# Patient Record
Sex: Female | Born: 1980 | Race: White | Hispanic: No | Marital: Married | Smoking: Never smoker
Health system: Southern US, Community
[De-identification: ages and names within clinical notes are randomized; demographics above are authoritative.]

## PROBLEM LIST (undated history)

## (undated) DIAGNOSIS — R112 Nausea with vomiting, unspecified: Secondary | ICD-10-CM

## (undated) DIAGNOSIS — R519 Headache, unspecified: Secondary | ICD-10-CM

## (undated) DIAGNOSIS — F419 Anxiety disorder, unspecified: Secondary | ICD-10-CM

## (undated) DIAGNOSIS — Z9889 Other specified postprocedural states: Secondary | ICD-10-CM

## (undated) DIAGNOSIS — K219 Gastro-esophageal reflux disease without esophagitis: Secondary | ICD-10-CM

## (undated) DIAGNOSIS — F32A Depression, unspecified: Secondary | ICD-10-CM

## (undated) DIAGNOSIS — J189 Pneumonia, unspecified organism: Secondary | ICD-10-CM

## (undated) DIAGNOSIS — R51 Headache: Secondary | ICD-10-CM

## (undated) DIAGNOSIS — F329 Major depressive disorder, single episode, unspecified: Secondary | ICD-10-CM

## (undated) HISTORY — PX: CARPAL TUNNEL RELEASE: SHX101

---

## 2014-12-15 ENCOUNTER — Emergency Department
Admit: 2014-12-15 | Disposition: A | Discharge status: Home / Self Care | Payer: Self-pay | Admitting: Emergency Medicine

## 2014-12-15 LAB — COMPREHENSIVE METABOLIC PANEL
ALT: 43 U/L
Albumin: 4.1 g/dL
Alkaline Phosphatase: 84 U/L
Anion Gap: 4 — ABNORMAL LOW (ref 7–16)
BUN: 11 mg/dL
Bilirubin,Total: 0.6 mg/dL
CHLORIDE: 108 mmol/L
CO2: 25 mmol/L
CREATININE: 0.73 mg/dL
Calcium, Total: 8.7 mg/dL — ABNORMAL LOW
EGFR (African American): >60
EGFR (Non-African Amer.): >60
Glucose: 142 mg/dL — ABNORMAL HIGH
Potassium: 3.4 mmol/L — ABNORMAL LOW
SGOT(AST): 33 U/L
SODIUM: 137 mmol/L
TOTAL PROTEIN: 7.9 g/dL

## 2014-12-15 LAB — CBC
HCT: 44.4 % (ref 35.0–47.0)
HGB: 14.7 g/dL (ref 12.0–16.0)
MCH: 27.9 pg (ref 26.0–34.0)
MCHC: 33.1 g/dL (ref 32.0–36.0)
MCV: 84 fL (ref 80–100)
Platelet: 223 10*3/uL (ref 150–440)
RBC: 5.27 10*6/uL — ABNORMAL HIGH (ref 3.80–5.20)
RDW: 13.9 % (ref 11.5–14.5)
WBC: 7.3 10*3/uL (ref 3.6–11.0)

## 2014-12-15 LAB — URINALYSIS, COMPLETE
Bilirubin,UR: NEGATIVE
Glucose,UR: NEGATIVE mg/dL (ref 0–75)
Ketone: NEGATIVE
Leukocyte Esterase: NEGATIVE
NITRITE: NEGATIVE
PROTEIN: NEGATIVE
Ph: 5 (ref 4.5–8.0)
SPECIFIC GRAVITY: 1.017 (ref 1.003–1.030)
Squamous Epithelial: 2
WBC UR: 3 /HPF (ref 0–5)

## 2014-12-15 LAB — TROPONIN I: Troponin-I: <0.03 ng/mL

## 2014-12-15 LAB — CK TOTAL AND CKMB (NOT AT ARMC)
CK, Total: 75 U/L
CK-MB: 1.3 ng/mL

## 2014-12-15 LAB — LIPASE, BLOOD: LIPASE: 50 U/L

## 2015-01-04 ENCOUNTER — Ambulatory Visit
Admit: 2015-01-04 | Disposition: A | Discharge status: Home / Self Care | Payer: Self-pay | Attending: Obstetrics and Gynecology | Admitting: Obstetrics and Gynecology

## 2015-01-14 ENCOUNTER — Inpatient Hospital Stay
Admission: RE | Admit: 2015-01-14 | Discharge: 2015-01-14 | Disposition: A | Discharge status: Home / Self Care | Payer: Self-pay | Source: Ambulatory Visit

## 2015-01-14 HISTORY — DX: Pneumonia, unspecified organism: J18.9

## 2015-01-14 HISTORY — DX: Headache: R51

## 2015-01-14 HISTORY — DX: Other specified postprocedural states: Z98.890

## 2015-01-14 HISTORY — DX: Nausea with vomiting, unspecified: R11.2

## 2015-01-14 HISTORY — DX: Depression, unspecified: F32.A

## 2015-01-14 HISTORY — DX: Major depressive disorder, single episode, unspecified: F32.9

## 2015-01-14 HISTORY — DX: Anxiety disorder, unspecified: F41.9

## 2015-01-14 HISTORY — DX: Headache, unspecified: R51.9

## 2015-01-14 HISTORY — DX: Gastro-esophageal reflux disease without esophagitis: K21.9

## 2015-01-17 ENCOUNTER — Encounter
Admission: RE | Admit: 2015-01-17 | Discharge: 2015-01-17 | Disposition: A | Discharge status: Home / Self Care | Payer: BLUE CROSS/BLUE SHIELD | Source: Ambulatory Visit | Attending: Urology | Admitting: Urology

## 2015-01-17 DIAGNOSIS — Z01812 Encounter for preprocedural laboratory examination: Secondary | ICD-10-CM | POA: Insufficient documentation

## 2015-01-17 DIAGNOSIS — R5383 Other fatigue: Secondary | ICD-10-CM | POA: Diagnosis not present

## 2015-01-17 DIAGNOSIS — Z8249 Family history of ischemic heart disease and other diseases of the circulatory system: Secondary | ICD-10-CM | POA: Diagnosis not present

## 2015-01-17 DIAGNOSIS — Z87898 Personal history of other specified conditions: Secondary | ICD-10-CM | POA: Diagnosis not present

## 2015-01-17 DIAGNOSIS — F418 Other specified anxiety disorders: Secondary | ICD-10-CM | POA: Diagnosis not present

## 2015-01-17 DIAGNOSIS — Z885 Allergy status to narcotic agent status: Secondary | ICD-10-CM | POA: Insufficient documentation

## 2015-01-17 DIAGNOSIS — N200 Calculus of kidney: Secondary | ICD-10-CM | POA: Diagnosis not present

## 2015-01-17 DIAGNOSIS — Z809 Family history of malignant neoplasm, unspecified: Secondary | ICD-10-CM | POA: Diagnosis not present

## 2015-01-17 DIAGNOSIS — K219 Gastro-esophageal reflux disease without esophagitis: Secondary | ICD-10-CM | POA: Insufficient documentation

## 2015-01-17 DIAGNOSIS — Z91040 Latex allergy status: Secondary | ICD-10-CM | POA: Insufficient documentation

## 2015-01-17 DIAGNOSIS — R51 Headache: Secondary | ICD-10-CM | POA: Insufficient documentation

## 2015-01-17 DIAGNOSIS — Z882 Allergy status to sulfonamides status: Secondary | ICD-10-CM | POA: Diagnosis not present

## 2015-01-17 LAB — URINALYSIS COMPLETE WITH MICROSCOPIC (ARMC ONLY)
BILIRUBIN URINE: NEGATIVE
GLUCOSE, UA: NEGATIVE mg/dL
Ketones, ur: NEGATIVE mg/dL
Leukocytes, UA: NEGATIVE
Nitrite: NEGATIVE
Protein, ur: NEGATIVE mg/dL
SPECIFIC GRAVITY, URINE: 1.021 (ref 1.005–1.030)
pH: 5 (ref 5.0–8.0)

## 2015-01-17 LAB — CBC
HEMATOCRIT: 43 % (ref 35.0–47.0)
HEMOGLOBIN: 14.2 g/dL (ref 12.0–16.0)
MCH: 28.1 pg (ref 26.0–34.0)
MCHC: 33.1 g/dL (ref 32.0–36.0)
MCV: 85 fL (ref 80.0–100.0)
Platelets: 234 10*3/uL (ref 150–440)
RBC: 5.06 MIL/uL (ref 3.80–5.20)
RDW: 14 % (ref 11.5–14.5)
WBC: 7.4 10*3/uL (ref 3.6–11.0)

## 2015-01-17 LAB — BASIC METABOLIC PANEL
ANION GAP: 8 (ref 5–15)
BUN: 11 mg/dL (ref 6–20)
CHLORIDE: 106 mmol/L (ref 101–111)
CO2: 25 mmol/L (ref 22–32)
Calcium: 9 mg/dL (ref 8.9–10.3)
Creatinine, Ser: 0.73 mg/dL (ref 0.44–1.00)
GFR calc non Af Amer: >60 mL/min (ref >60)
Glucose, Bld: 94 mg/dL (ref 65–99)
POTASSIUM: 3.9 mmol/L (ref 3.5–5.1)
Sodium: 139 mmol/L (ref 135–145)

## 2015-01-18 DIAGNOSIS — Z79899 Other long term (current) drug therapy: Secondary | ICD-10-CM | POA: Diagnosis not present

## 2015-01-18 DIAGNOSIS — N135 Crossing vessel and stricture of ureter without hydronephrosis: Secondary | ICD-10-CM | POA: Diagnosis not present

## 2015-01-18 DIAGNOSIS — Z882 Allergy status to sulfonamides status: Secondary | ICD-10-CM | POA: Diagnosis not present

## 2015-01-18 DIAGNOSIS — Z91040 Latex allergy status: Secondary | ICD-10-CM | POA: Diagnosis not present

## 2015-01-18 DIAGNOSIS — Z91048 Other nonmedicinal substance allergy status: Secondary | ICD-10-CM | POA: Diagnosis not present

## 2015-01-18 DIAGNOSIS — Z8489 Family history of other specified conditions: Secondary | ICD-10-CM | POA: Diagnosis not present

## 2015-01-18 DIAGNOSIS — N200 Calculus of kidney: Secondary | ICD-10-CM | POA: Diagnosis not present

## 2015-01-18 DIAGNOSIS — K219 Gastro-esophageal reflux disease without esophagitis: Secondary | ICD-10-CM | POA: Diagnosis not present

## 2015-01-18 DIAGNOSIS — Z885 Allergy status to narcotic agent status: Secondary | ICD-10-CM | POA: Diagnosis not present

## 2015-01-18 DIAGNOSIS — Z809 Family history of malignant neoplasm, unspecified: Secondary | ICD-10-CM | POA: Diagnosis not present

## 2015-01-18 DIAGNOSIS — Z8249 Family history of ischemic heart disease and other diseases of the circulatory system: Secondary | ICD-10-CM | POA: Diagnosis not present

## 2015-01-18 LAB — URINE CULTURE: Culture: NO GROWTH

## 2015-01-21 ENCOUNTER — Encounter
Admission: RE | Disposition: A | Discharge status: Home / Self Care | Payer: Self-pay | Source: Ambulatory Visit | Attending: Urology

## 2015-01-21 ENCOUNTER — Encounter: Payer: Self-pay | Admitting: *Deleted

## 2015-01-21 ENCOUNTER — Emergency Department
Admission: EM | Admit: 2015-01-21 | Discharge: 2015-01-22 | Discharge status: Against Medical Advice | Payer: BLUE CROSS/BLUE SHIELD | Attending: Emergency Medicine | Admitting: Emergency Medicine

## 2015-01-21 ENCOUNTER — Ambulatory Visit
Admission: RE | Admit: 2015-01-21 | Discharge: 2015-01-21 | Disposition: A | Discharge status: Home / Self Care | Payer: BLUE CROSS/BLUE SHIELD | Source: Ambulatory Visit | Attending: Urology | Admitting: Urology

## 2015-01-21 ENCOUNTER — Ambulatory Visit: Payer: BLUE CROSS/BLUE SHIELD | Admitting: Anesthesiology

## 2015-01-21 DIAGNOSIS — N135 Crossing vessel and stricture of ureter without hydronephrosis: Secondary | ICD-10-CM | POA: Insufficient documentation

## 2015-01-21 DIAGNOSIS — Z91048 Other nonmedicinal substance allergy status: Secondary | ICD-10-CM | POA: Insufficient documentation

## 2015-01-21 DIAGNOSIS — K219 Gastro-esophageal reflux disease without esophagitis: Secondary | ICD-10-CM | POA: Insufficient documentation

## 2015-01-21 DIAGNOSIS — G8918 Other acute postprocedural pain: Secondary | ICD-10-CM | POA: Insufficient documentation

## 2015-01-21 DIAGNOSIS — Z79899 Other long term (current) drug therapy: Secondary | ICD-10-CM | POA: Insufficient documentation

## 2015-01-21 DIAGNOSIS — Z91040 Latex allergy status: Secondary | ICD-10-CM | POA: Insufficient documentation

## 2015-01-21 DIAGNOSIS — Z8249 Family history of ischemic heart disease and other diseases of the circulatory system: Secondary | ICD-10-CM | POA: Insufficient documentation

## 2015-01-21 DIAGNOSIS — Z882 Allergy status to sulfonamides status: Secondary | ICD-10-CM | POA: Insufficient documentation

## 2015-01-21 DIAGNOSIS — N200 Calculus of kidney: Secondary | ICD-10-CM | POA: Diagnosis not present

## 2015-01-21 DIAGNOSIS — Z809 Family history of malignant neoplasm, unspecified: Secondary | ICD-10-CM | POA: Insufficient documentation

## 2015-01-21 DIAGNOSIS — Z8489 Family history of other specified conditions: Secondary | ICD-10-CM | POA: Insufficient documentation

## 2015-01-21 DIAGNOSIS — Z885 Allergy status to narcotic agent status: Secondary | ICD-10-CM | POA: Insufficient documentation

## 2015-01-21 HISTORY — PX: URETEROSCOPY WITH HOLMIUM LASER LITHOTRIPSY: SHX6645

## 2015-01-21 LAB — POCT PREGNANCY, URINE: Preg Test, Ur: NEGATIVE

## 2015-01-21 SURGERY — URETEROSCOPY, WITH LITHOTRIPSY USING HOLMIUM LASER
Anesthesia: General | Laterality: Right | Wound class: Clean Contaminated

## 2015-01-21 MED ORDER — LEVOFLOXACIN IN D5W 500 MG/100ML IV SOLN
INTRAVENOUS | Status: AC
  Administered 2015-01-21: 500 mg via INTRAVENOUS
  Filled 2015-01-21: qty 100

## 2015-01-21 MED ORDER — DEXAMETHASONE SODIUM PHOSPHATE 10 MG/ML IJ SOLN
INTRAMUSCULAR | Status: AC
  Administered 2015-01-21: 19:00:00
  Filled 2015-01-21: qty 1

## 2015-01-21 MED ORDER — ONDANSETRON HCL 4 MG/2ML IJ SOLN
INTRAMUSCULAR | Status: AC
  Filled 2015-01-21: qty 2

## 2015-01-21 MED ORDER — MIDAZOLAM HCL 2 MG/2ML IJ SOLN
INTRAMUSCULAR | Status: DC | PRN
  Administered 2015-01-21: 2 mg via INTRAVENOUS

## 2015-01-21 MED ORDER — OXYCODONE-ACETAMINOPHEN 5-325 MG PO TABS: 1.0000 | ORAL_TABLET | Freq: Four times a day (QID) | ORAL | Status: AC | PRN

## 2015-01-21 MED ORDER — LIDOCAINE HCL (CARDIAC) 20 MG/ML IV SOLN
INTRAVENOUS | Status: DC | PRN
  Administered 2015-01-21: 100 mg via INTRAVENOUS

## 2015-01-21 MED ORDER — METOCLOPRAMIDE HCL 5 MG/ML IJ SOLN
INTRAMUSCULAR | Status: AC
  Filled 2015-01-21: qty 2

## 2015-01-21 MED ORDER — ONDANSETRON HCL 4 MG/2ML IJ SOLN
4.0000 mg | Freq: Once | INTRAMUSCULAR | Status: AC | PRN
  Administered 2015-01-21: 4 mg via INTRAVENOUS

## 2015-01-21 MED ORDER — LEVOFLOXACIN IN D5W 500 MG/100ML IV SOLN
500.0000 mg | INTRAVENOUS | Status: DC
  Administered 2015-01-21: 500 mg via INTRAVENOUS

## 2015-01-21 MED ORDER — LACTATED RINGERS IV SOLN
INTRAVENOUS | Status: DC
  Administered 2015-01-21 (×2): via INTRAVENOUS

## 2015-01-21 MED ORDER — FENTANYL CITRATE (PF) 100 MCG/2ML IJ SOLN
INTRAMUSCULAR | Status: DC
Start: 2015-01-21 — End: 2015-01-21
  Filled 2015-01-21: qty 2

## 2015-01-21 MED ORDER — FENTANYL CITRATE (PF) 100 MCG/2ML IJ SOLN
INTRAMUSCULAR | Status: DC | PRN
  Administered 2015-01-21 (×2): 50 ug via INTRAVENOUS
  Administered 2015-01-21: 100 ug via INTRAVENOUS

## 2015-01-21 MED ORDER — BELLADONNA ALKALOIDS-OPIUM 16.2-60 MG RE SUPP
RECTAL | Status: DC | PRN
  Administered 2015-01-21: 1 via RECTAL

## 2015-01-21 MED ORDER — SODIUM CHLORIDE 0.9 % IR SOLN
Status: DC | PRN
  Administered 2015-01-21: 900 mL

## 2015-01-21 MED ORDER — FAMOTIDINE 20 MG PO TABS
20.0000 mg | ORAL_TABLET | Freq: Once | ORAL | Status: AC
  Administered 2015-01-21: 20 mg via ORAL

## 2015-01-21 MED ORDER — SUCCINYLCHOLINE CHLORIDE 20 MG/ML IJ SOLN
INTRAMUSCULAR | Status: DC | PRN
  Administered 2015-01-21: 100 mg via INTRAVENOUS

## 2015-01-21 MED ORDER — FENTANYL CITRATE (PF) 100 MCG/2ML IJ SOLN
25.0000 ug | INTRAMUSCULAR | Status: AC | PRN
  Administered 2015-01-21 (×4): 25 ug via INTRAVENOUS

## 2015-01-21 MED ORDER — ONDANSETRON HCL 4 MG/2ML IJ SOLN
INTRAMUSCULAR | Status: DC | PRN
Start: 2015-01-21 — End: 2015-01-21
  Administered 2015-01-21: 4 mg via INTRAVENOUS

## 2015-01-21 MED ORDER — BUPIVACAINE HCL (PF) 0.5 % IJ SOLN
INTRAMUSCULAR | Status: AC
  Filled 2015-01-21: qty 30

## 2015-01-21 MED ORDER — DEXAMETHASONE SODIUM PHOSPHATE 10 MG/ML IJ SOLN: 10.0000 mg | Freq: Once | INTRAMUSCULAR | Status: DC

## 2015-01-21 MED ORDER — ACETAMINOPHEN 10 MG/ML IV SOLN
INTRAVENOUS | Status: AC
  Administered 2015-01-21: 1000 mg via INTRAVENOUS
  Filled 2015-01-21: qty 100

## 2015-01-21 MED ORDER — IOTHALAMATE MEGLUMINE 43 % IV SOLN
INTRAVENOUS | Status: DC | PRN
  Administered 2015-01-21: 20 mL

## 2015-01-21 MED ORDER — DEXAMETHASONE SODIUM PHOSPHATE 4 MG/ML IJ SOLN
INTRAMUSCULAR | Status: DC | PRN
  Administered 2015-01-21: 5 mg via INTRAVENOUS

## 2015-01-21 MED ORDER — FAMOTIDINE 20 MG PO TABS
ORAL_TABLET | ORAL | Status: AC
  Filled 2015-01-21: qty 1

## 2015-01-21 MED ORDER — PROPOFOL 10 MG/ML IV BOLUS
INTRAVENOUS | Status: DC | PRN
  Administered 2015-01-21: 180 mg via INTRAVENOUS

## 2015-01-21 MED ORDER — BUPIVACAINE HCL 0.5 % IJ SOLN
INTRAMUSCULAR | Status: DC | PRN
  Administered 2015-01-21: 30 mL

## 2015-01-21 MED ORDER — METOCLOPRAMIDE HCL 5 MG/ML IJ SOLN: 10.0000 mg | Freq: Once | INTRAMUSCULAR | Status: DC

## 2015-01-21 MED ORDER — BELLADONNA ALKALOIDS-OPIUM 16.2-60 MG RE SUPP
RECTAL | Status: AC
  Filled 2015-01-21: qty 1

## 2015-01-21 SURGICAL SUPPLY — 27 items
BAG DRAIN CYSTO-URO LG1000N (MISCELLANEOUS) ×3 IMPLANT
BASKET ZERO TIP 1.9FR (BASKET) ×3 IMPLANT
CATH URETL 5X70 OPEN END (CATHETERS) ×3 IMPLANT
CNTNR SPEC 2.5X3XGRAD LEK (MISCELLANEOUS) ×1
CONRAY 43 FOR UROLOGY 50M (MISCELLANEOUS) ×3 IMPLANT
CONT SPEC 4OZ STER OR WHT (MISCELLANEOUS) ×2
CONTAINER SPEC 2.5X3XGRAD LEK (MISCELLANEOUS) ×1 IMPLANT
GLOVE BIO SURGEON STRL SZ7 (GLOVE) ×6 IMPLANT
GLOVE BIO SURGEON STRL SZ7.5 (GLOVE) ×3 IMPLANT
GOWN STRL REUS W/ TWL LRG LVL3 (GOWN DISPOSABLE) ×1 IMPLANT
GOWN STRL REUS W/ TWL XL LVL3 (GOWN DISPOSABLE) ×1 IMPLANT
GOWN STRL REUS W/TWL LRG LVL3 (GOWN DISPOSABLE) ×2
GOWN STRL REUS W/TWL XL LVL3 (GOWN DISPOSABLE) ×2
GUIDEWIRE STR ZIPWIRE 035X150 (MISCELLANEOUS) ×3 IMPLANT
INTRODUCER DILATOR DOUBLE (INTRODUCER) ×3 IMPLANT
JELLY LUB 2OZ STRL (MISCELLANEOUS) ×2
JELLY LUBE 2OZ STRL (MISCELLANEOUS) ×1 IMPLANT
LASER HOLMIUM SU 200UM (MISCELLANEOUS) ×3 IMPLANT
LASER HOLMIUM SU 940UM (MISCELLANEOUS) ×3 IMPLANT
PACK CYSTO AR (MISCELLANEOUS) ×3 IMPLANT
PREP PVP WINGED SPONGE (MISCELLANEOUS) ×3 IMPLANT
SENSORWIRE 0.038 NOT ANGLED (WIRE) ×3
SET CYSTO W/LG BORE CLAMP LF (SET/KITS/TRAYS/PACK) ×3 IMPLANT
SOL .9 NS 3000ML IRR  AL (IV SOLUTION) ×2
SOL .9 NS 3000ML IRR UROMATIC (IV SOLUTION) ×1 IMPLANT
WATER STERILE IRR 1000ML POUR (IV SOLUTION) ×3 IMPLANT
WIRE SENSOR 0.038 NOT ANGLED (WIRE) ×1 IMPLANT

## 2015-01-21 NOTE — H&P (Signed)
..  Date of Initial H&P: 01/03/15   History reviewed, patient examined, no change in status, stable for surgery. 

## 2015-01-21 NOTE — ED Notes (Signed)
Pt had ureteroscopy today here at [facility]. Pt states since getting home her pain pills are not controlling her pain. Pt her for treatment of her pain

## 2015-01-21 NOTE — Anesthesia Postprocedure Evaluation (Signed)
  Anesthesia Post-op Note  Patient: Connie Edwards  Procedure(s) Performed: Procedure(s): URETEROSCOPY WITH HOLMIUM LASER LITHOTRIPSY (Right)  Anesthesia type:General ETT  Patient location: PACU  Post pain: Pain level controlled  Post assessment: Post-op Vital signs reviewed, Patient's Cardiovascular Status Stable, Respiratory Function Stable, Patent Airway and No signs of Nausea or vomiting  Post vital signs: Reviewed and stable  Last Vitals:  Filed Vitals:   01/21/15 1826  BP: 116/68  Pulse:   Temp:   Resp:     Level of consciousness: awake, alert  and patient cooperative  Complications: No apparent anesthesia complications

## 2015-01-21 NOTE — Anesthesia Procedure Notes (Signed)
Procedure Name: Intubation Date/Time: 01/21/2015 4:54 PM Performed by: Stormy FabianURTIS, Thula Stewart Pre-anesthesia Checklist: Patient identified, Emergency Drugs available, Suction available, Patient being monitored and Timeout performed Patient Re-evaluated:Patient Re-evaluated prior to inductionOxygen Delivery Method: Circle system utilized Preoxygenation: Pre-oxygenation with 100% oxygen Intubation Type: IV induction Ventilation: Mask ventilation without difficulty Laryngoscope Size: Mac and 3 Grade View: Grade I Tube type: Oral Tube size: 7.0 mm Number of attempts: 1 Airway Equipment and Method: Stylet Placement Confirmation: ETT inserted through vocal cords under direct vision,  positive ETCO2 and breath sounds checked- equal and bilateral Secured at: 21 cm Tube secured with: Tape Dental Injury: Teeth and Oropharynx as per pre-operative assessment

## 2015-01-21 NOTE — Transfer of Care (Signed)
Immediate Anesthesia Transfer of Care Note  Patient: Connie Edwards  Procedure(s) Performed: Procedure(s): URETEROSCOPY WITH HOLMIUM LASER LITHOTRIPSY (Right)  Patient Location: PACU  Anesthesia Type:General  Level of Consciousness: awake, alert  and oriented  Airway & Oxygen Therapy: Patient Spontanous Breathing and Patient connected to face mask oxygen  Post-op Assessment: Report given to RN and Post -op Vital signs reviewed and stable  Post vital signs: Reviewed and stable  Last Vitals:  Filed Vitals:   01/21/15 1217  BP: 118/70  Pulse: 64  Temp: 36.7 C  Resp: 16    Complications: No apparent anesthesia complications

## 2015-01-21 NOTE — Discharge Instructions (Addendum)
Drink two quarts water daily

## 2015-01-21 NOTE — Op Note (Signed)
Preop ureteral calculous Postop renal calculous Procedure  Cysto, right retrograde pyelogram, ureteroscopy stent Anes: general  With the patient sterile draped,in the supine lithotomy position for ease of approach to the external genitalia we begin the procedure.  A time-out is taken and then with a 21FR Cystoscope shealth we ender the bladder.  30 degree lens is utilized.  Right retrograde is done utilizing a 435fr open ended catheter and omnipaq contrast  No filling defect is seen in the ureter but a calculous is seen in the right lower calyx.  A double lumen ureteral access catheter is put up over a 0.35 sensor wire.  A second wire is put up thru the catheter and then the dilator is removed.  A Navigator is then placed up over the wire and the wire and obturator is removed. A digital flexible scope is placed up the ureter thru the navigator get beyond a scarred narrowing of the mid ureter.  A digital scope goes to the kidney.  A calculous is located and disintigrated to a very small size It is too small to put into a basket.  The scope and sheath are removed. It is checked for position and the bladder is emptied thru the cystoscope sheath.  30ml of 0.5% marcaine is put in the bladder and sheath is withdrawn.    A60mg  Belladonna and opium suppository is placed in the rectum.  There a normal rectal exam is completed.  The bladder itself showed no turmors, masses or calculi  The patient was sent to the recovery room in satisfactory condition.

## 2015-01-21 NOTE — Anesthesia Preprocedure Evaluation (Addendum)
Anesthesia Evaluation    Reviewed: Allergy & Precautions, H&P , Patient's Chart, lab work & pertinent test results  Airway Mallampati: II  TM Distance: >3 FB Neck ROM: full    Dental  (+) Teeth Intact   Pulmonary          Cardiovascular     Neuro/Psych    GI/Hepatic GERD- (Pt told to take meds after scope. Pt did not know that she had this condition prior. No symptomes)  Poorly Controlled,  Endo/Other    Renal/GU      Musculoskeletal   Abdominal   Peds  Hematology   Anesthesia Other Findings   Reproductive/Obstetrics                           Anesthesia Physical Anesthesia Plan  ASA: II  Anesthesia Plan: General ETT   Post-op Pain Management:    Induction:   Airway Management Planned:   Additional Equipment:   Intra-op Plan:   Post-operative Plan:   Informed Consent: I have reviewed the patients History and Physical, chart, labs and discussed the procedure including the risks, benefits and alternatives for the proposed anesthesia with the patient or authorized representative who has indicated his/her understanding and acceptance.     Plan Discussed with: CRNA and Surgeon  Anesthesia Plan Comments:        Anesthesia Quick Evaluation

## 2015-01-24 ENCOUNTER — Encounter: Payer: Self-pay | Admitting: Urology

## 2015-03-04 ENCOUNTER — Other Ambulatory Visit: Payer: Self-pay | Admitting: Family Medicine

## 2015-03-04 DIAGNOSIS — N200 Calculus of kidney: Secondary | ICD-10-CM

## 2015-03-29 ENCOUNTER — Emergency Department
Admission: EM | Admit: 2015-03-29 | Discharge: 2015-03-30 | Disposition: A | Discharge status: Home / Self Care | Payer: BLUE CROSS/BLUE SHIELD | Attending: Emergency Medicine | Admitting: Emergency Medicine

## 2015-03-29 ENCOUNTER — Other Ambulatory Visit: Payer: Self-pay

## 2015-03-29 DIAGNOSIS — M545 Low back pain, unspecified: Secondary | ICD-10-CM

## 2015-03-29 DIAGNOSIS — G8929 Other chronic pain: Secondary | ICD-10-CM | POA: Diagnosis not present

## 2015-03-29 DIAGNOSIS — Z793 Long term (current) use of hormonal contraceptives: Secondary | ICD-10-CM | POA: Insufficient documentation

## 2015-03-29 DIAGNOSIS — Z3202 Encounter for pregnancy test, result negative: Secondary | ICD-10-CM | POA: Diagnosis not present

## 2015-03-29 DIAGNOSIS — Z79899 Other long term (current) drug therapy: Secondary | ICD-10-CM | POA: Diagnosis not present

## 2015-03-29 LAB — POCT PREGNANCY, URINE: Preg Test, Ur: NEGATIVE

## 2015-03-29 MED ORDER — KETOROLAC TROMETHAMINE 60 MG/2ML IM SOLN
60.0000 mg | Freq: Once | INTRAMUSCULAR | Status: AC
  Administered 2015-03-30: 60 mg via INTRAMUSCULAR
  Filled 2015-03-29: qty 2

## 2015-03-29 NOTE — ED Notes (Signed)
Pt complains of low back pain. ptstates has seen primary md and prescribed a muscle relaxant and tramadol without relief of pain.

## 2015-03-29 NOTE — ED Provider Notes (Signed)
[facility] Emergency Department Provider Note  ____________________________________________  Time seen:  11:08 PM  I have reviewed the triage vital signs and the nursing notes.   HISTORY  Chief Complaint Back Pain   HPI Connie Edwards is a 34 y.o. female is here tonight with complaint of low back pain. She states she saw her PCP and was prescribed muscle relaxants and tramadol which is not helping at all. She does have a history of back pain and has been treated in the past with other medication. She denies any urinary symptoms. She denies any paresthesias, bowel or bladder incontinence. Pain is approximately L5-S1 area without radiation. There is been no history of injury. Currently she rates her pain an 8 out of 10.   Past Medical History  Diagnosis Date  . Anxiety   . Depression   . GERD (gastroesophageal reflux disease)   . Pneumonia   . Headache     rare  . PONV (postoperative nausea and vomiting)     after 2nd c-section with spinal    There are no active problems to display for this patient.   Past Surgical History  Procedure Laterality Date  . Cesarean section    . Carpal tunnel release Bilateral   . Ureteroscopy with holmium laser lithotripsy Right 01/21/2015    Procedure: URETEROSCOPY WITH HOLMIUM LASER LITHOTRIPSY;  Surgeon: Lorraine Lax, MD;  Location: [facility] ORS;  Service: Urology;  Laterality: Right;    Current Outpatient Rx  Name  Route  Sig  Dispense  Refill  . acetaminophen (TYLENOL) 500 MG tablet   Oral   Take 1,000 mg by mouth every 8 (eight) hours as needed for headache.         . escitalopram (LEXAPRO) 20 MG tablet   Oral   Take 20 mg by mouth at bedtime.          Marland Kitchen etonogestrel (NEXPLANON) 68 MG IMPL implant   Subdermal   1 each by Subdermal route once.         Marland Kitchen oxyCODONE-acetaminophen (ROXICET) 5-325 MG per tablet   Oral   Take 1 tablet by mouth every 6 (six) hours as needed for severe pain.   30 tablet    0     Allergies Sulfa antibiotics; Latex; Morphine and related; and Tape  No family history on file.  Social History History  Substance Use Topics  . Smoking status: Never Smoker   . Smokeless tobacco: Not on file  . Alcohol Use: No    Review of Systems Constitutional: No fever/chills Eyes: No visual changes. Cardiovascular: Denies chest pain. Respiratory: Denies shortness of breath. Gastrointestinal: No abdominal pain.  No nausea, no vomiting.  Genitourinary: Negative for dysuria. Musculoskeletal: Positive for back pain. Skin: Negative for rash. Neurological: Negative for headaches, focal weakness or numbness.  10-point ROS otherwise negative.  ____________________________________________   PHYSICAL EXAM:  VITAL SIGNS: ED Triage Vitals  Enc Vitals Group     BP 03/29/15 2221 132/76 mmHg     Pulse Rate 03/29/15 2221 86     Resp 03/29/15 2221 14     Temp 03/29/15 2221 98.8 F (37.1 C)     Temp Source 03/29/15 2221 Oral     SpO2 03/29/15 2221 100 %     Weight 03/29/15 2221 254 lb (115.214 kg)     Height 03/29/15 2221  (1.702 m)     Head Cir --      Peak Flow --  Pain Score 03/29/15 2222 8     Pain Loc --      Pain Edu? --      Excl. in GC? --     Constitutional: Alert and oriented. Well appearing and in no acute distress. Eyes: Conjunctivae are normal. PERRL. EOMI. Head: Atraumatic. Nose: No congestion/rhinnorhea. Neck: No stridor.   Cardiovascular: Normal rate, regular rhythm. Grossly normal heart sounds.  Good peripheral circulation. Respiratory: Normal respiratory effort.  No retractions. Lungs CTAB. Gastrointestinal: Soft and nontender. No distention. No CVA tenderness. Musculoskeletal: Back exam no gross deformity. There is minimal tenderness right paravertebral muscles. Range of motion is unrestricted and no muscle spasms were seen. Normal gait was noted. Straight leg raises were approximately 70 with minimal discomfort. No lower extremity  tenderness nor edema.  No joint effusions. Neurologic:  Normal speech and language. No gross focal neurologic deficits are appreciated. No gait instability. Reflexes were 2+ bilaterally Skin:  Skin is warm, dry and intact. No rash noted. Psychiatric: Mood and affect are normal. Speech and behavior are normal.  ____________________________________________   LABS (all labs ordered are listed, but only abnormal results are displayed)  Labs Reviewed  POCT PREGNANCY, URINE  POC URINE PREG, ED    PROCEDURES  Procedure(s) performed: None  Critical Care performed: No  ____________________________________________   INITIAL IMPRESSION / ASSESSMENT AND PLAN / ED COURSE  Pertinent labs & imaging results that were available during my care of the patient were reviewed by me and considered in my medical decision making (see chart for details).  [facility] is to [facility] for any continued back problems. She is to continue with her current medication. She is told to use ice or heat to her back as needed for back pain and to place 2 pillows underneath her knees when lying in bed. ____________________________________________   FINAL CLINICAL IMPRESSION(S) / ED DIAGNOSES  Final diagnoses:  Acute exacerbation of chronic low back pain      Tommi RumpsRhonda L Tyrique Sporn, PA-C 03/30/15 0007  Myrna Blazeravid Matthew Schaevitz, MD 04/02/15 2207

## 2015-03-30 NOTE — ED Notes (Signed)
Computer in room where pt was is not working, [employer] representative-signature not done. Pt verbalizes understanding of d/c instructions and has no further questions. Pt provided with work note as requested for 7/15.

## 2015-04-01 ENCOUNTER — Telehealth: Payer: Self-pay | Admitting: *Deleted

## 2015-04-01 NOTE — Telephone Encounter (Signed)
Received fax from [facility] requesting confirmation of an order for 24 hour urine collection kit.  I called and spoke with Otila Kluver at ext. [phone] and confirmed the order per Dr. Erlene Quan.  This is the second order for this pt. in a short time because the first collection was performed incorrectly. sm, cma

## 2016-09-29 IMAGING — US US RENAL KIDNEY
1 series · 14 of 25 positions shown · non-contrast
Comparison: None.

CLINICAL DATA: Right flank pain

EXAM:
RENAL/URINARY TRACT ULTRASOUND COMPLETE

[Series 1: us renal kidney · 0.25mm/px · 14 of 38 slices shown]
[im 1/38]
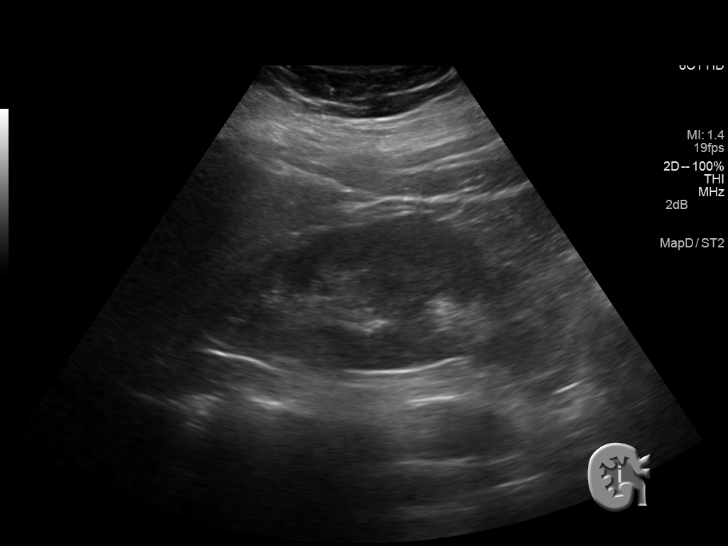
[im 4/38]
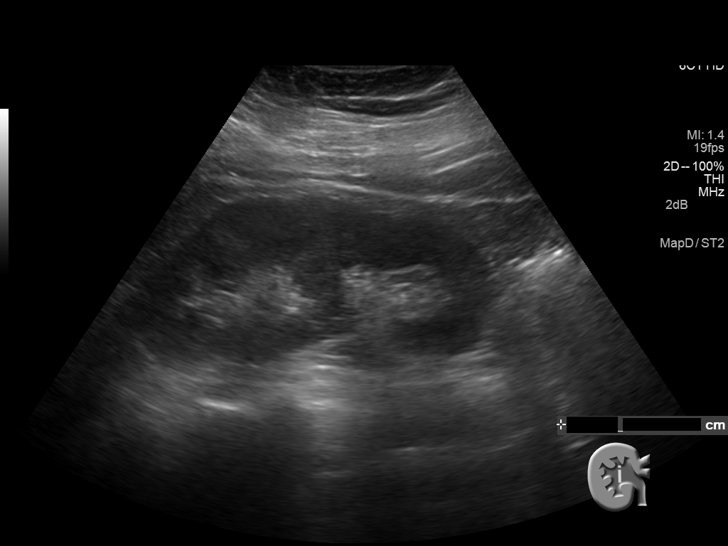
[im 7/38]
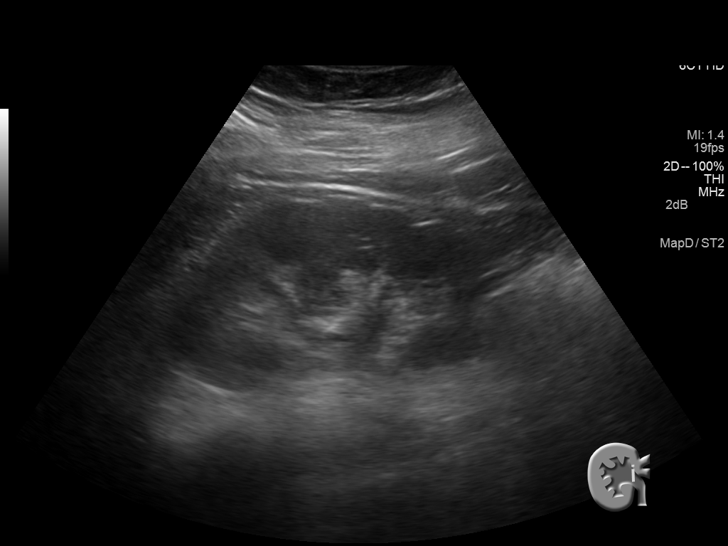
[im 10/38]
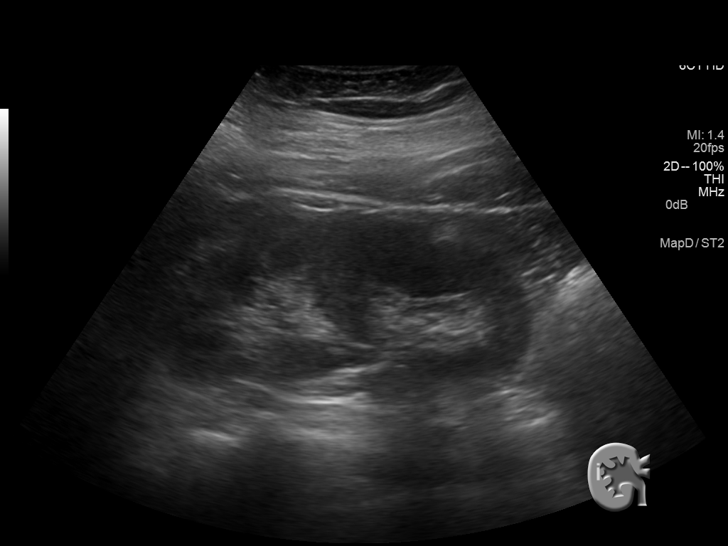
[im 13/38]
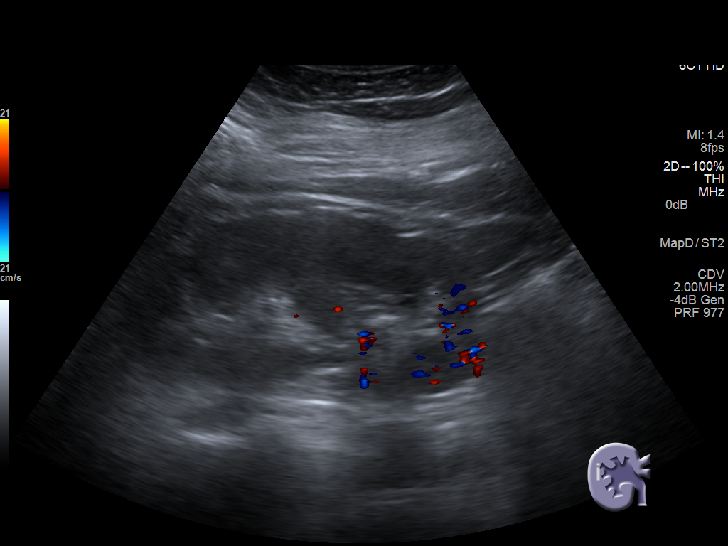
[im 14/38]
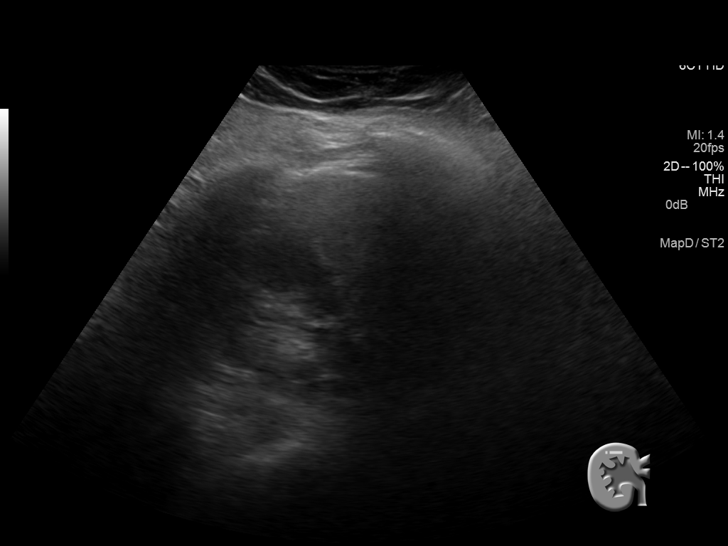
[im 17/38]
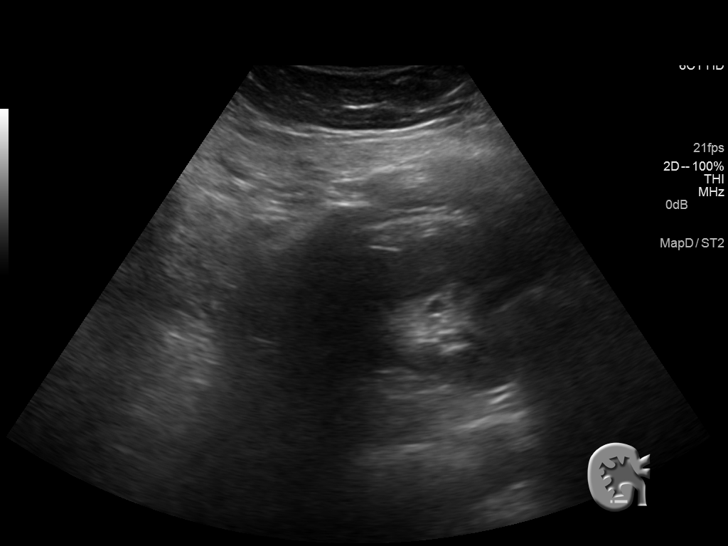
[im 21/38]
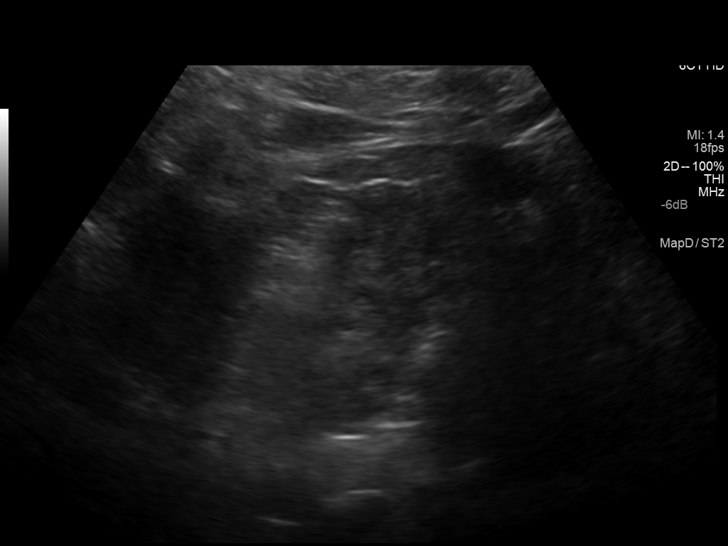
[im 24/38]
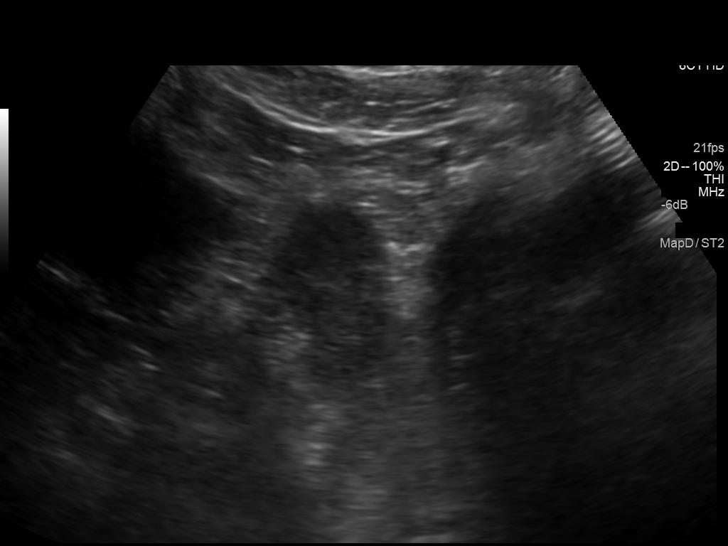
[im 25/38]
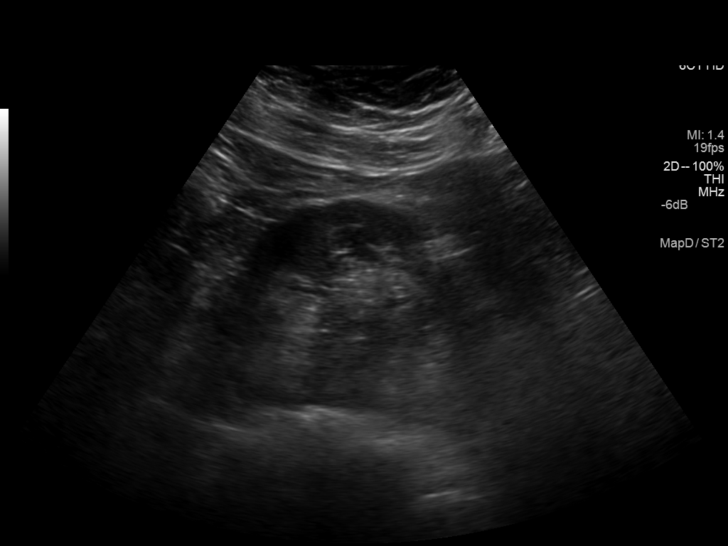
[im 28/38]
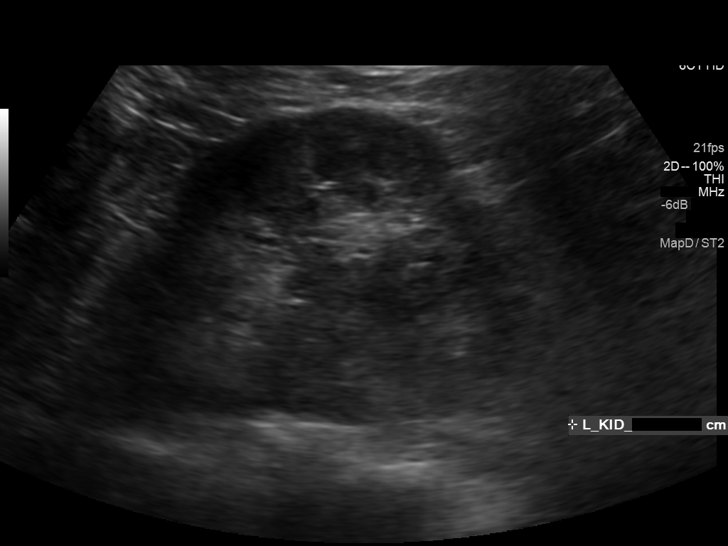
[im 31/38]
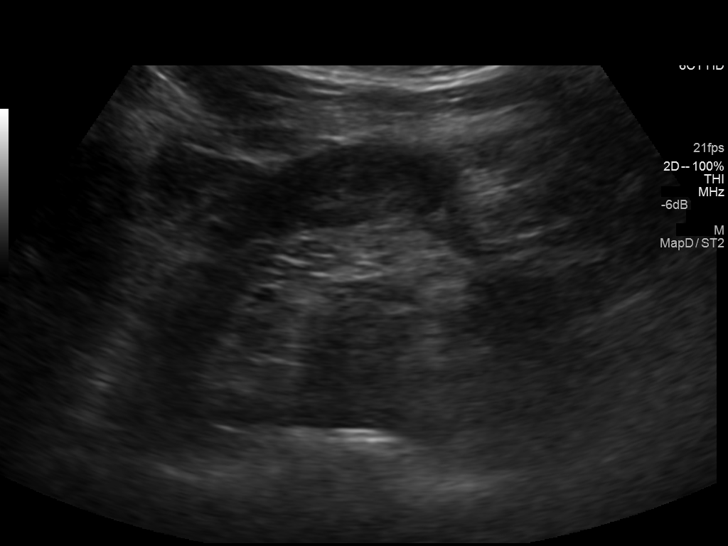
[im 34/38]
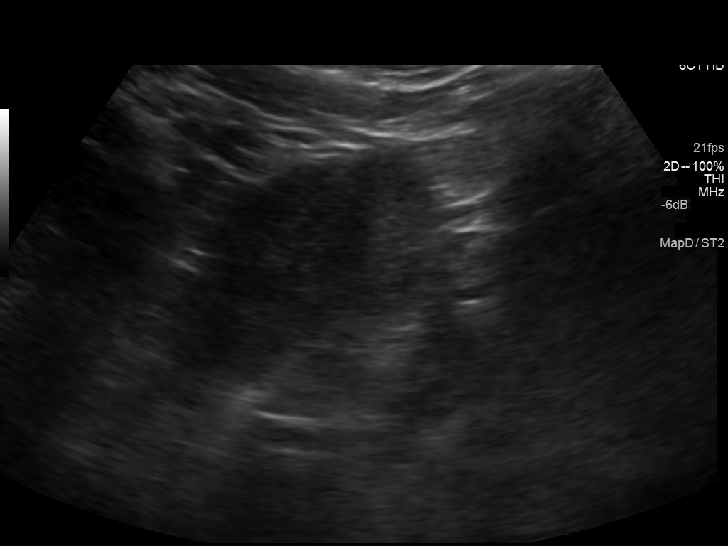
[im 38/38]
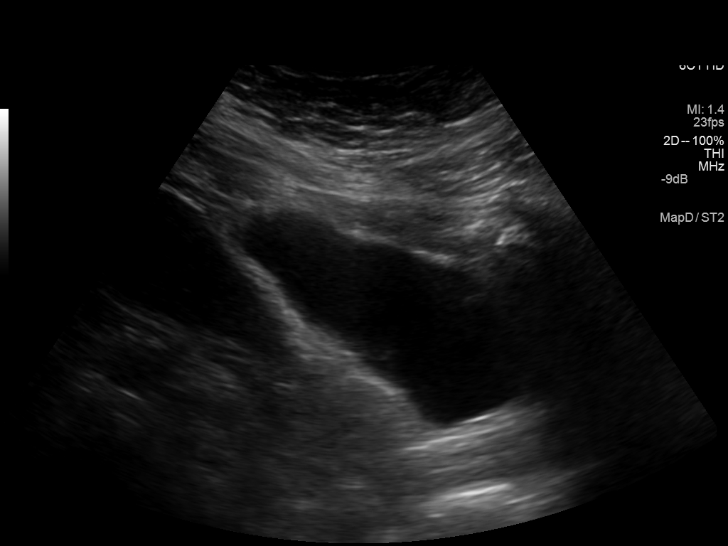

[14 of 25 positions shown; findings below may reference images not displayed]

FINDINGS: Right Kidney:

Length: 13.9 cm. There is a lower pole calculus measuring 11 mm. No
hydronephrosis.

Left Kidney:

Length: 9.6 cm. The left kidney is ectopic, located in the pelvis.
No hydronephrosis.

Bladder:

Appears normal for degree of bladder distention.
IMPRESSION: Right nephrolithiasis. Negative for hydronephrosis. Ectopic left
kidney.

## 2016-10-19 IMAGING — CR DG ABDOMEN 1V
1 series · 2 of 2 positions shown · non-contrast
Comparison: None.

CLINICAL DATA: Kidney stones.

EXAM:
ABDOMEN - 1 VIEW

[Series 1: kdxr kidney ureter bladder · 0.14mm/px · 2 of 2 slices shown]
[im 1/2]
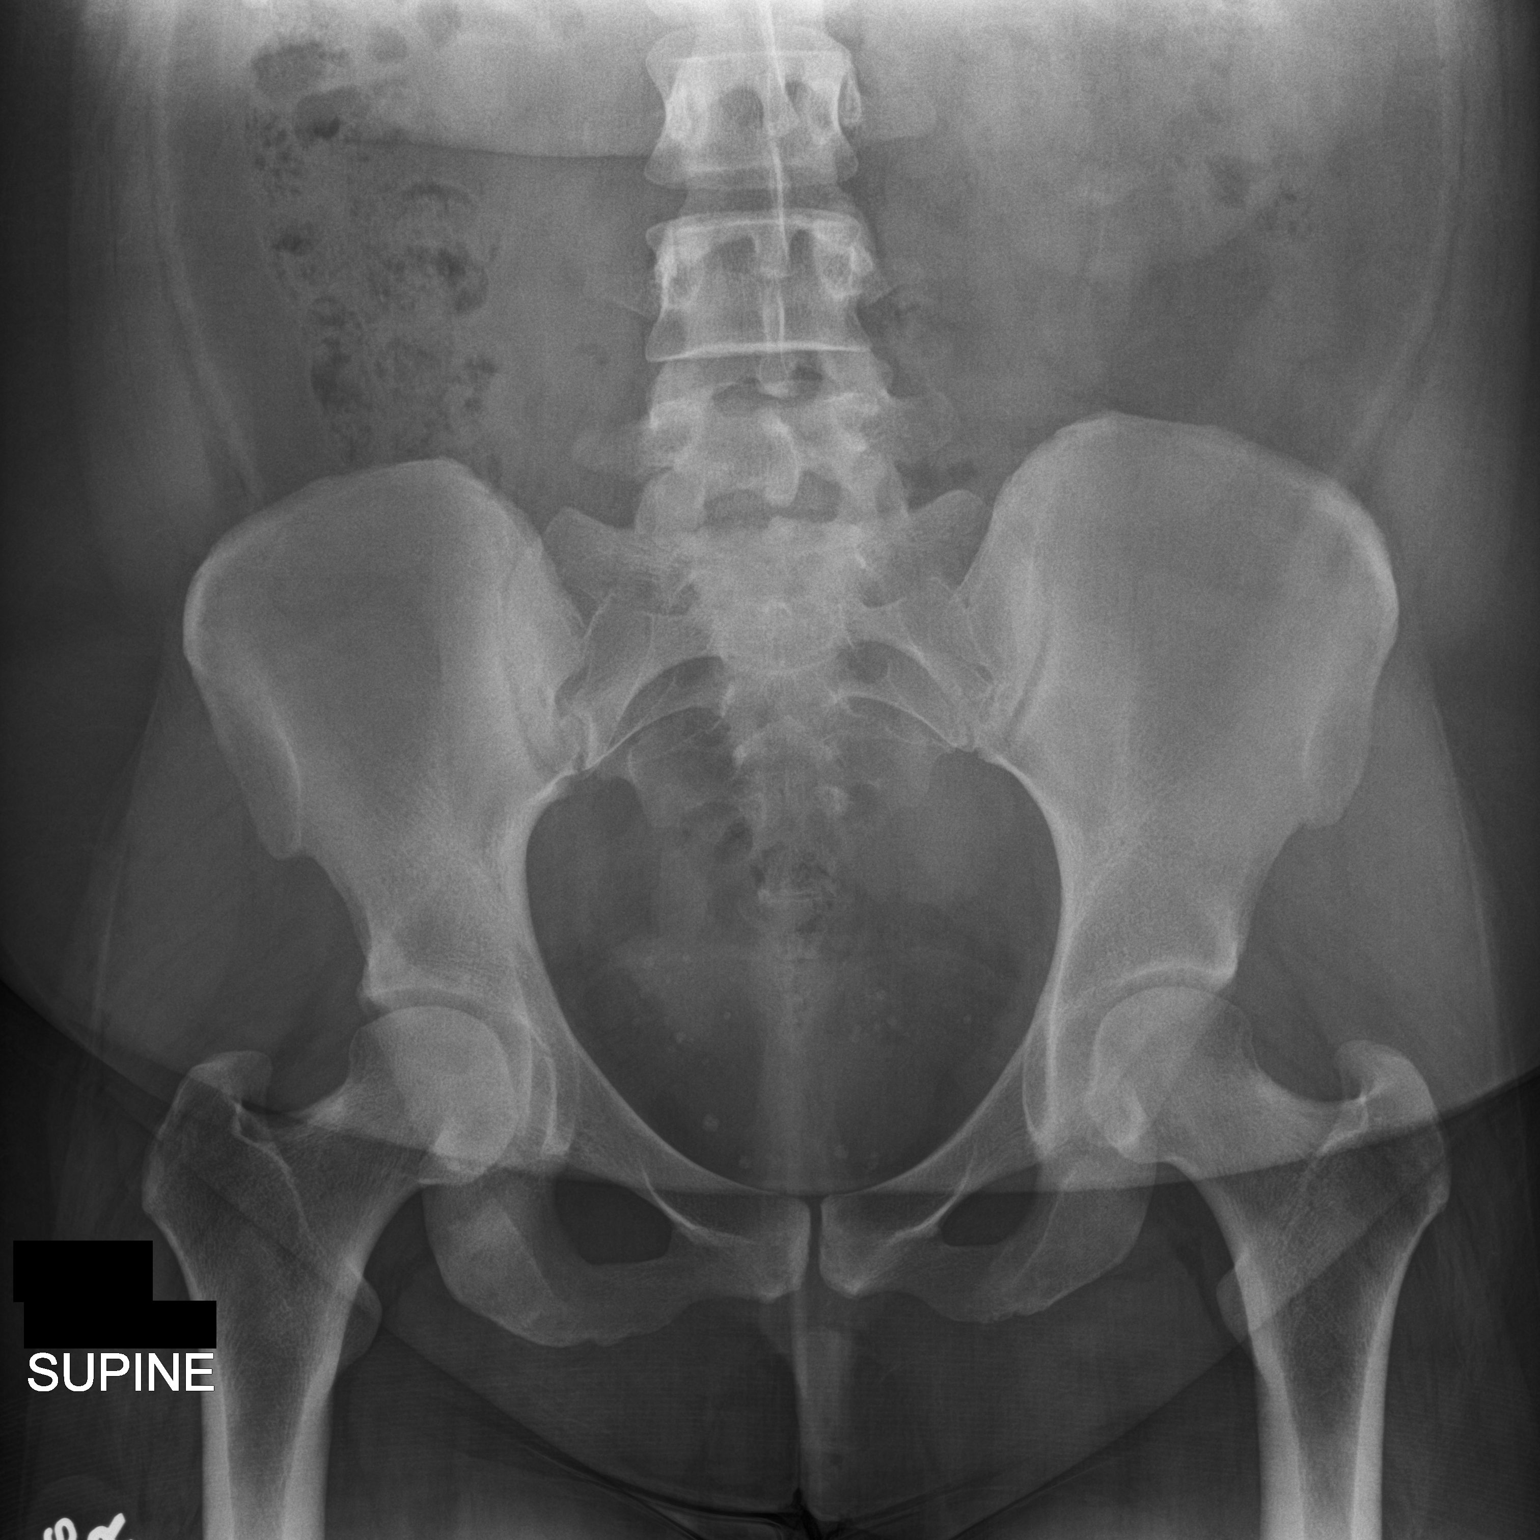
[im 2/2]
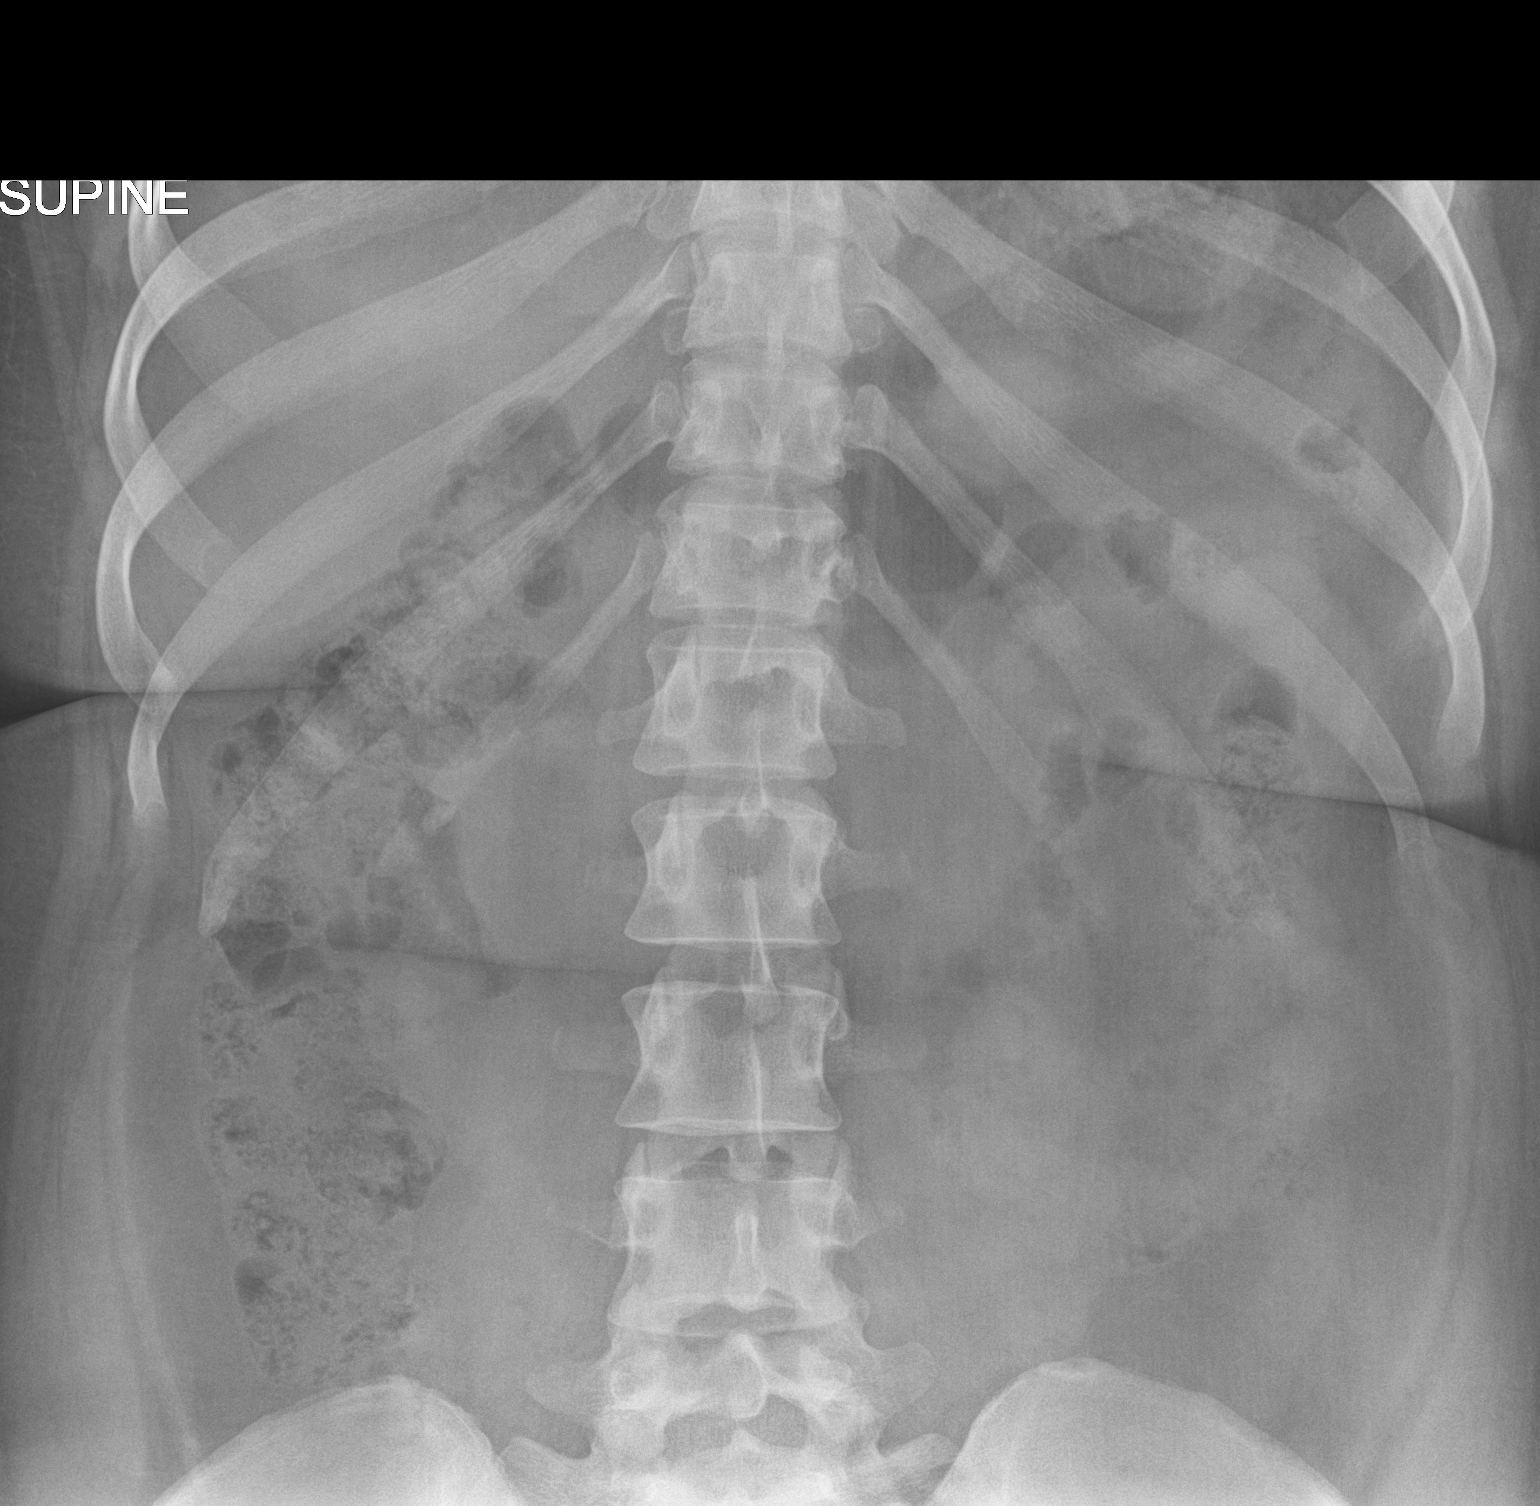

[2 of 2 positions shown; findings below may reference images not displayed]

FINDINGS: Soft tissue structures are unremarkable. Calcified densities noted
in the pelvis consistent with phleboliths. No bowel distention. No
free air. No acute bony abnormality.
IMPRESSION: No acute or focal abnormality.
